# Patient Record
Sex: Male | Born: 1980 | Race: Black or African American | Hispanic: No | Smoking: Former smoker
Health system: Southern US, Community
[De-identification: ages and names within clinical notes are randomized; demographics above are authoritative.]

## PROBLEM LIST (undated history)

## (undated) DIAGNOSIS — Z789 Other specified health status: Secondary | ICD-10-CM

## (undated) HISTORY — PX: TOOTH EXTRACTION: SUR596

## (undated) HISTORY — PX: APPENDECTOMY: SHX54

---

## 2006-01-22 ENCOUNTER — Emergency Department: Payer: Self-pay | Admitting: Emergency Medicine

## 2007-07-17 ENCOUNTER — Emergency Department: Payer: Self-pay | Admitting: Emergency Medicine

## 2007-09-24 IMAGING — CR DG SHOULDER 3+V*L*
1 series · 3 of 3 positions shown · non-contrast
Comparison: none

REASON FOR EXAM: Trauma
COMMENTS:

PROCEDURE:     DXR - DXR SHOULDER LEFT COMPLETE  - January 22, 2006  [DATE]
RESULT:     Views of the LEFT shoulder reveal findings consistent with an AC
joint separation.  The glenohumeral joint is normal in appearance and the
subacromial/subdeltoid spaces appear normal.  The scapula is intact.

[Series 1: view not recorded · 0.17mm/px · 3 of 3 slices shown]
[im 1/3]
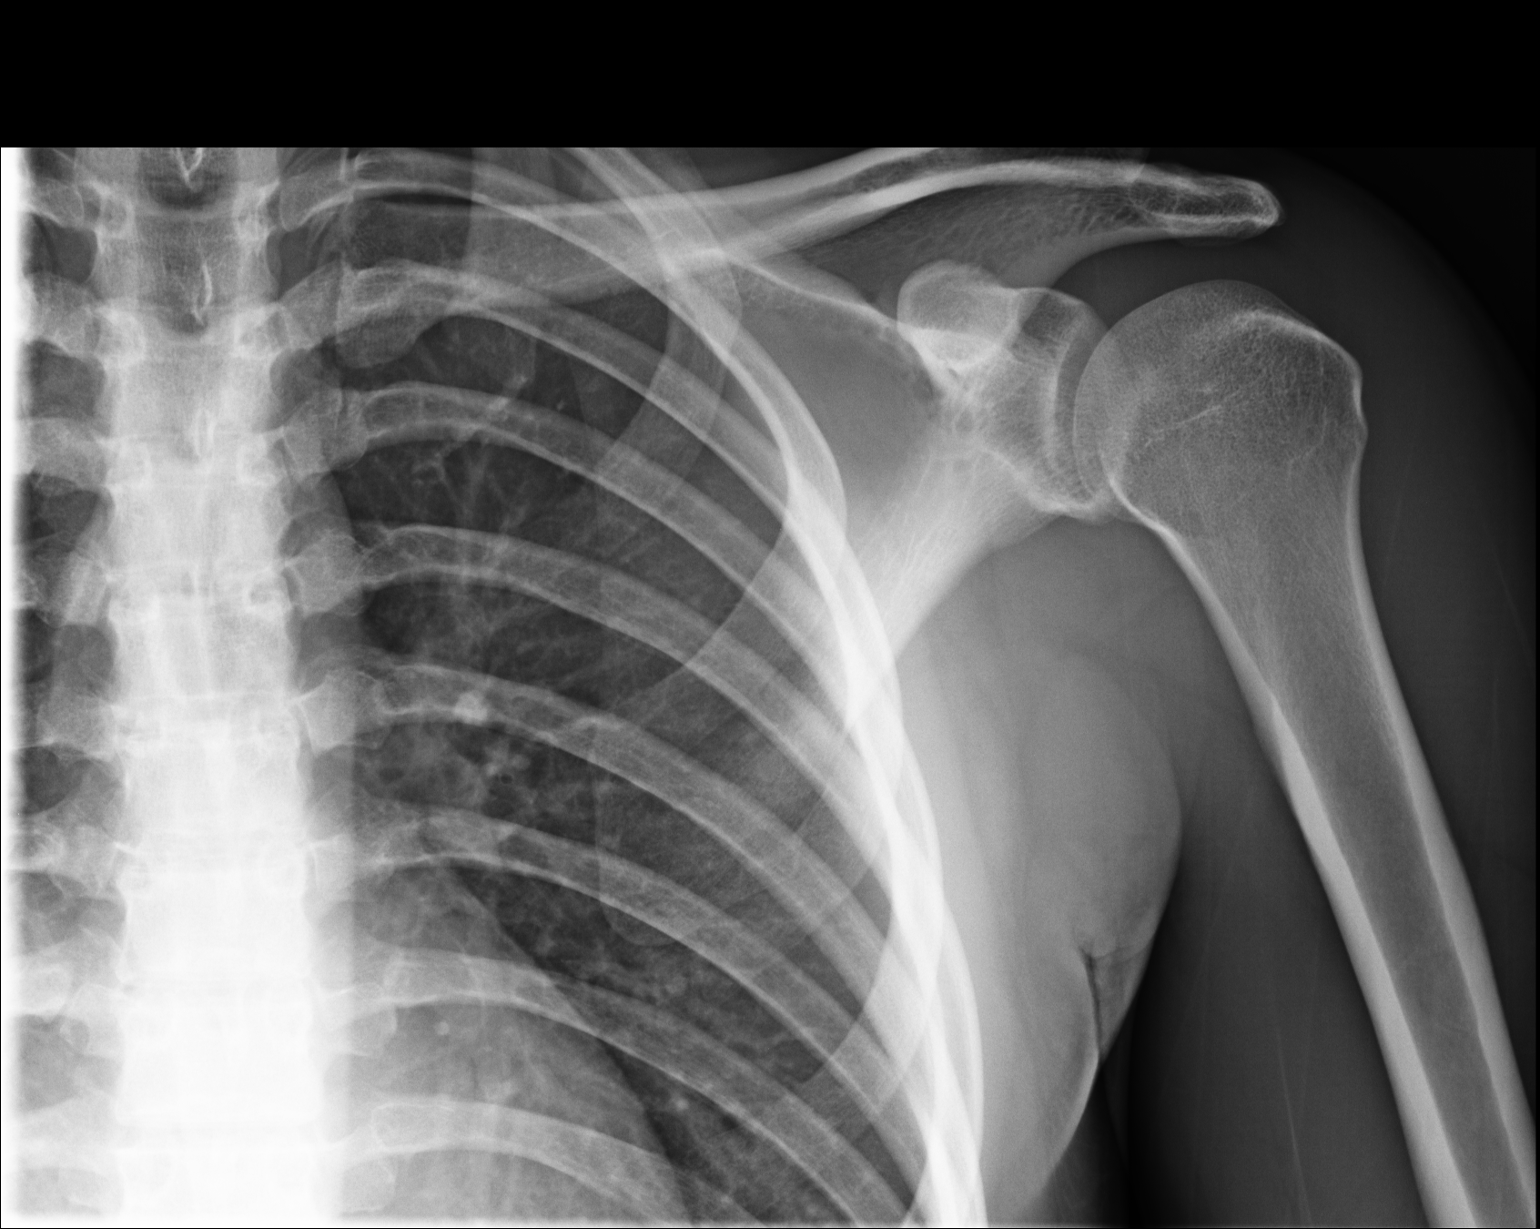
[im 2/3]
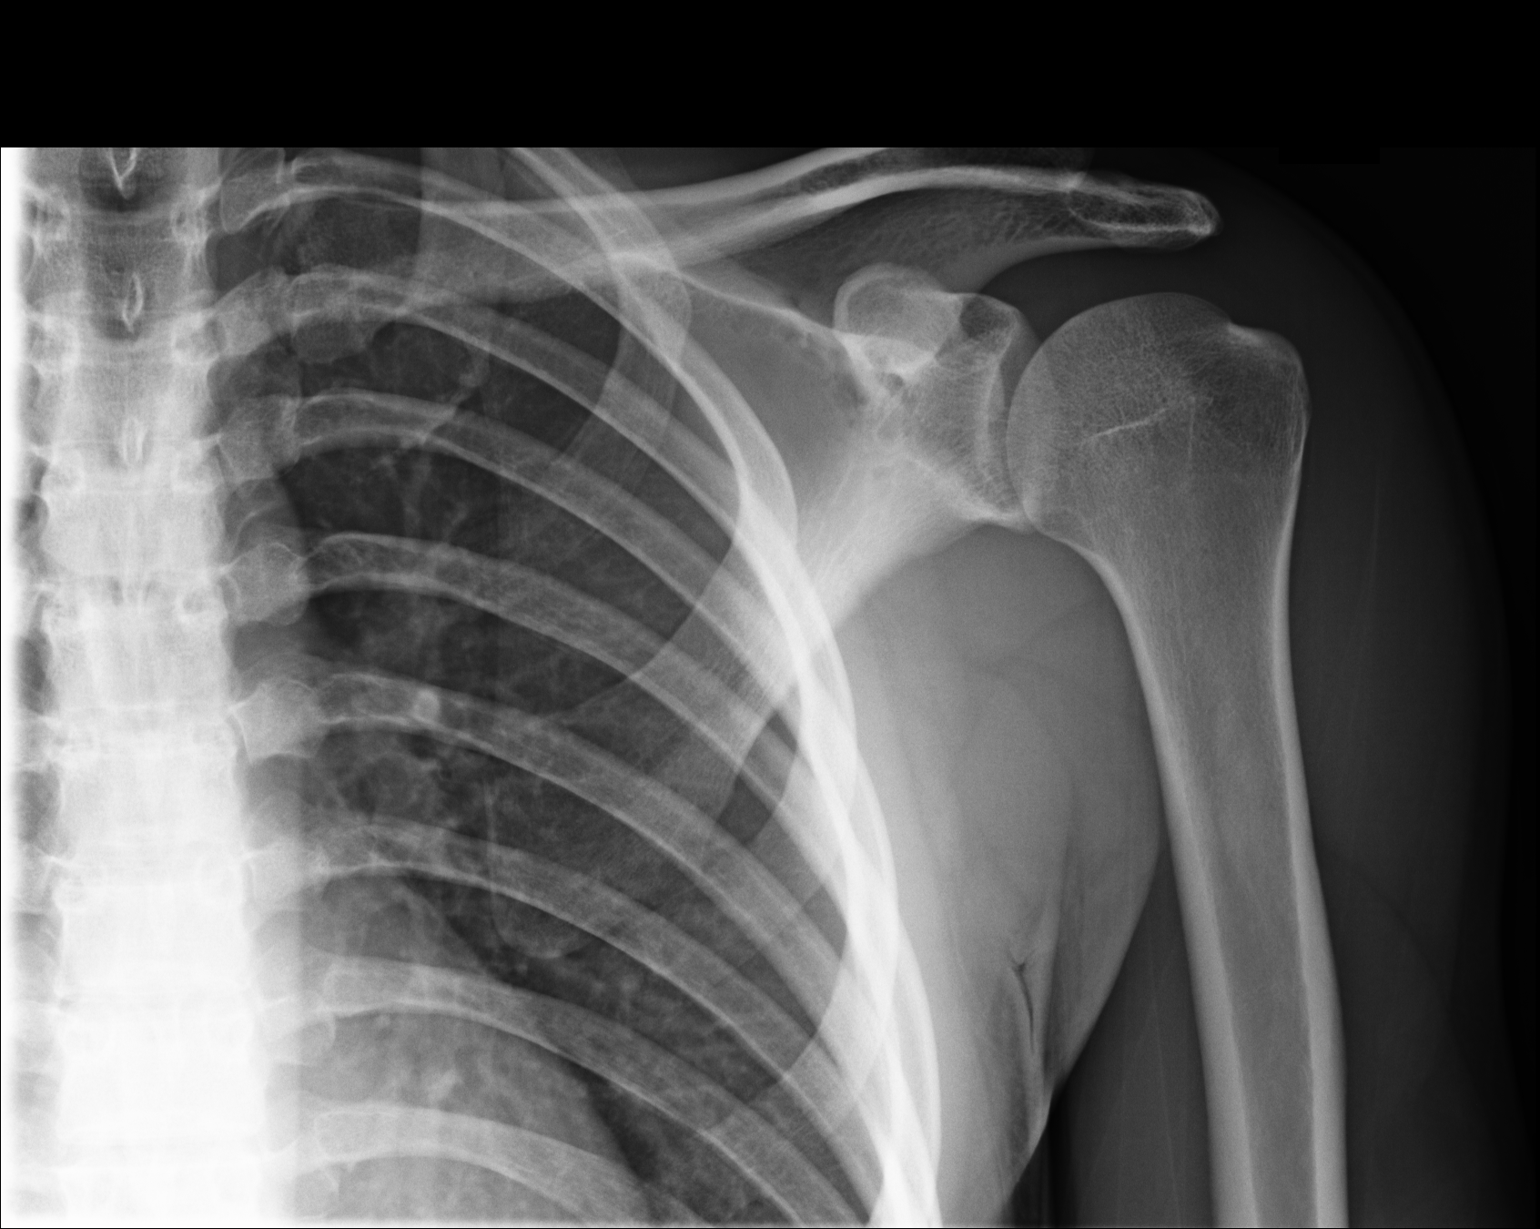
[im 3/3]
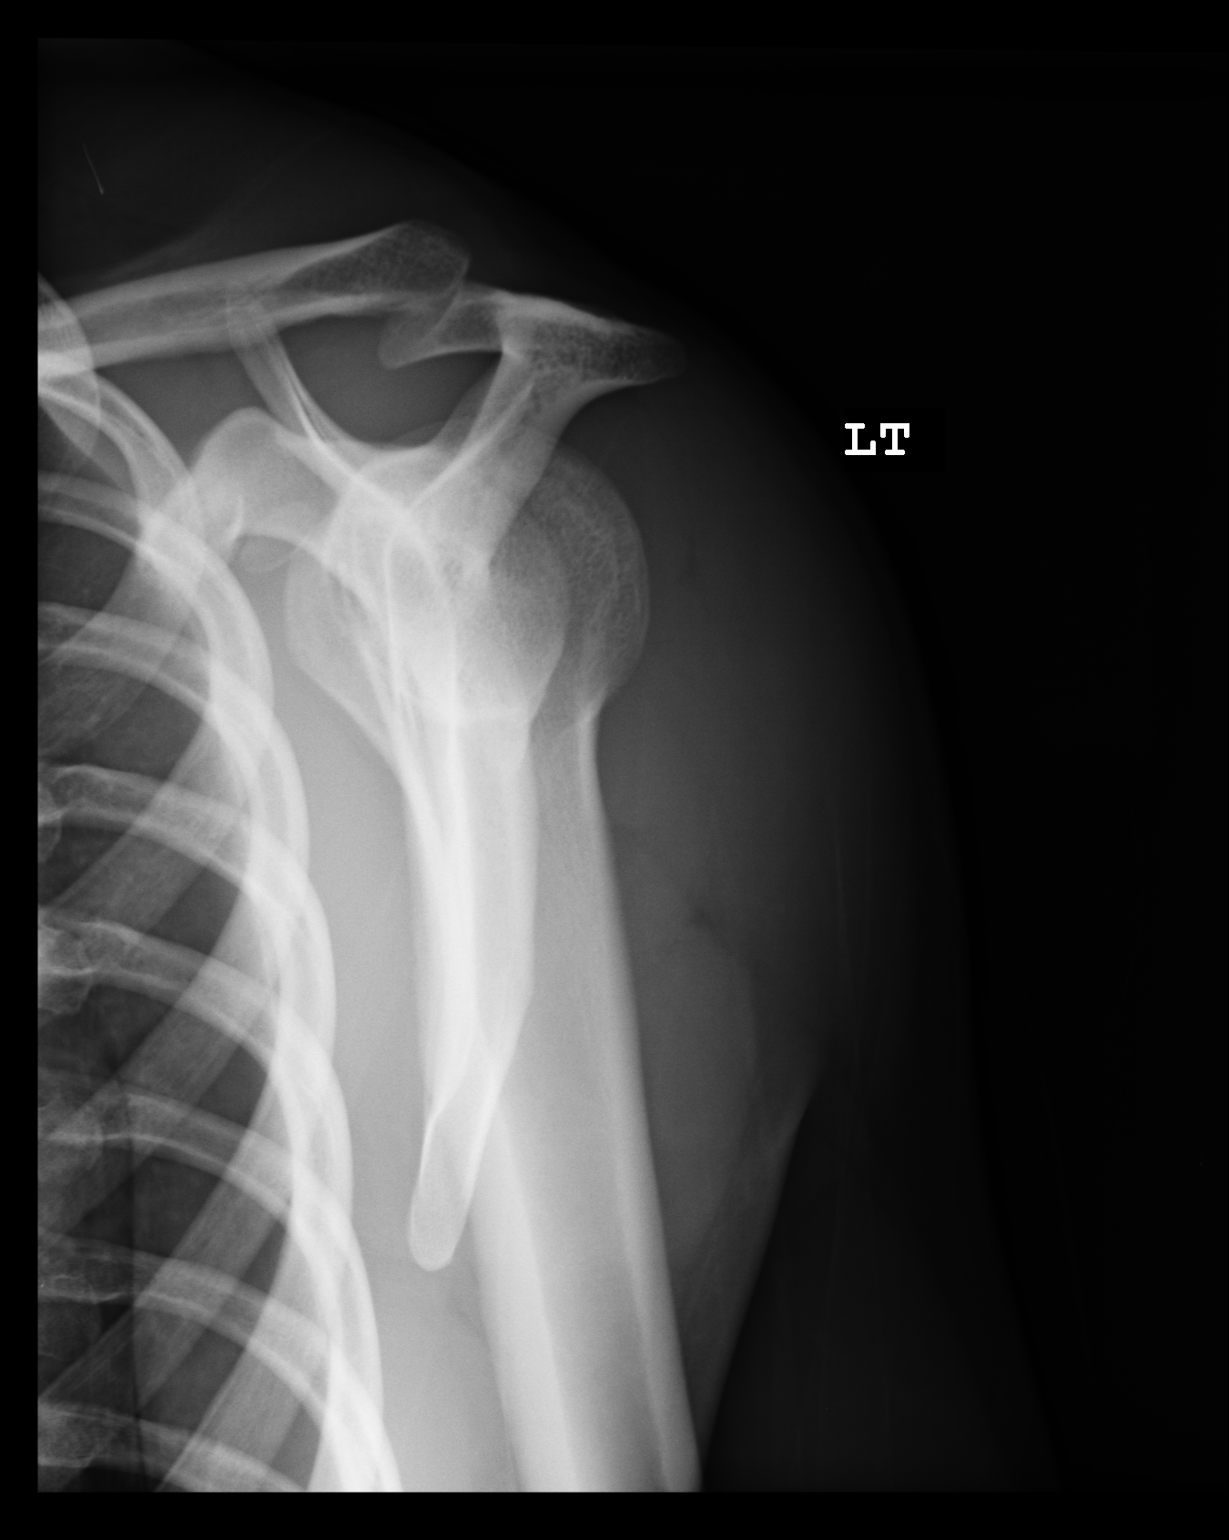

[3 of 3 positions shown; findings below may reference images not displayed]

IMPRESSION: The patient has sustained AC joint separation.

## 2008-09-28 ENCOUNTER — Emergency Department: Payer: Self-pay | Admitting: Emergency Medicine

## 2009-03-18 IMAGING — CR RIGHT HAND - COMPLETE 3+ VIEW
1 series · 3 of 3 positions shown · non-contrast
Comparison: none

REASON FOR EXAM: injury, pain, swelling
COMMENTS:   LMP: (Male)

PROCEDURE:     DXR - DXR HAND RT COMPLETE W/OBLIQUES  - July 17, 2007  [DATE]
RESULT:     No fracture, dislocation or other acute bony abnormality is
identified.

[Series 1: view not recorded · 0.17mm/px · 3 of 3 slices shown]
[im 1/3]
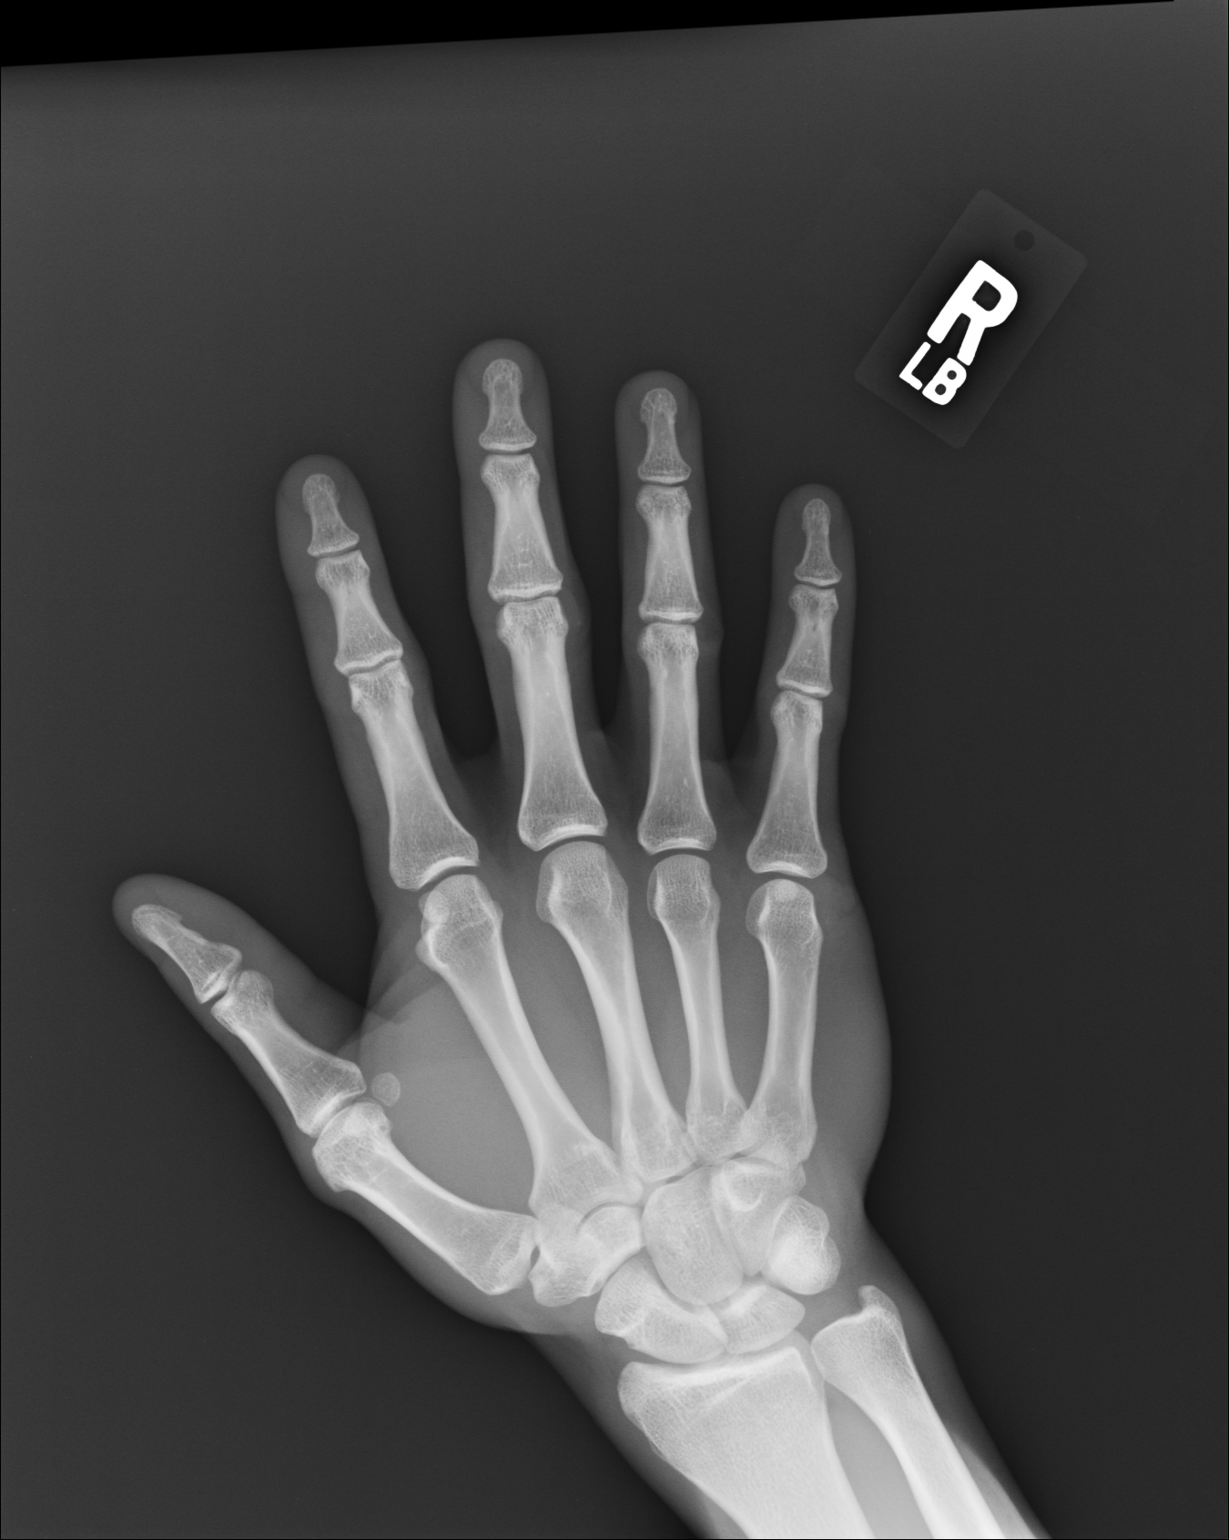
[im 2/3]
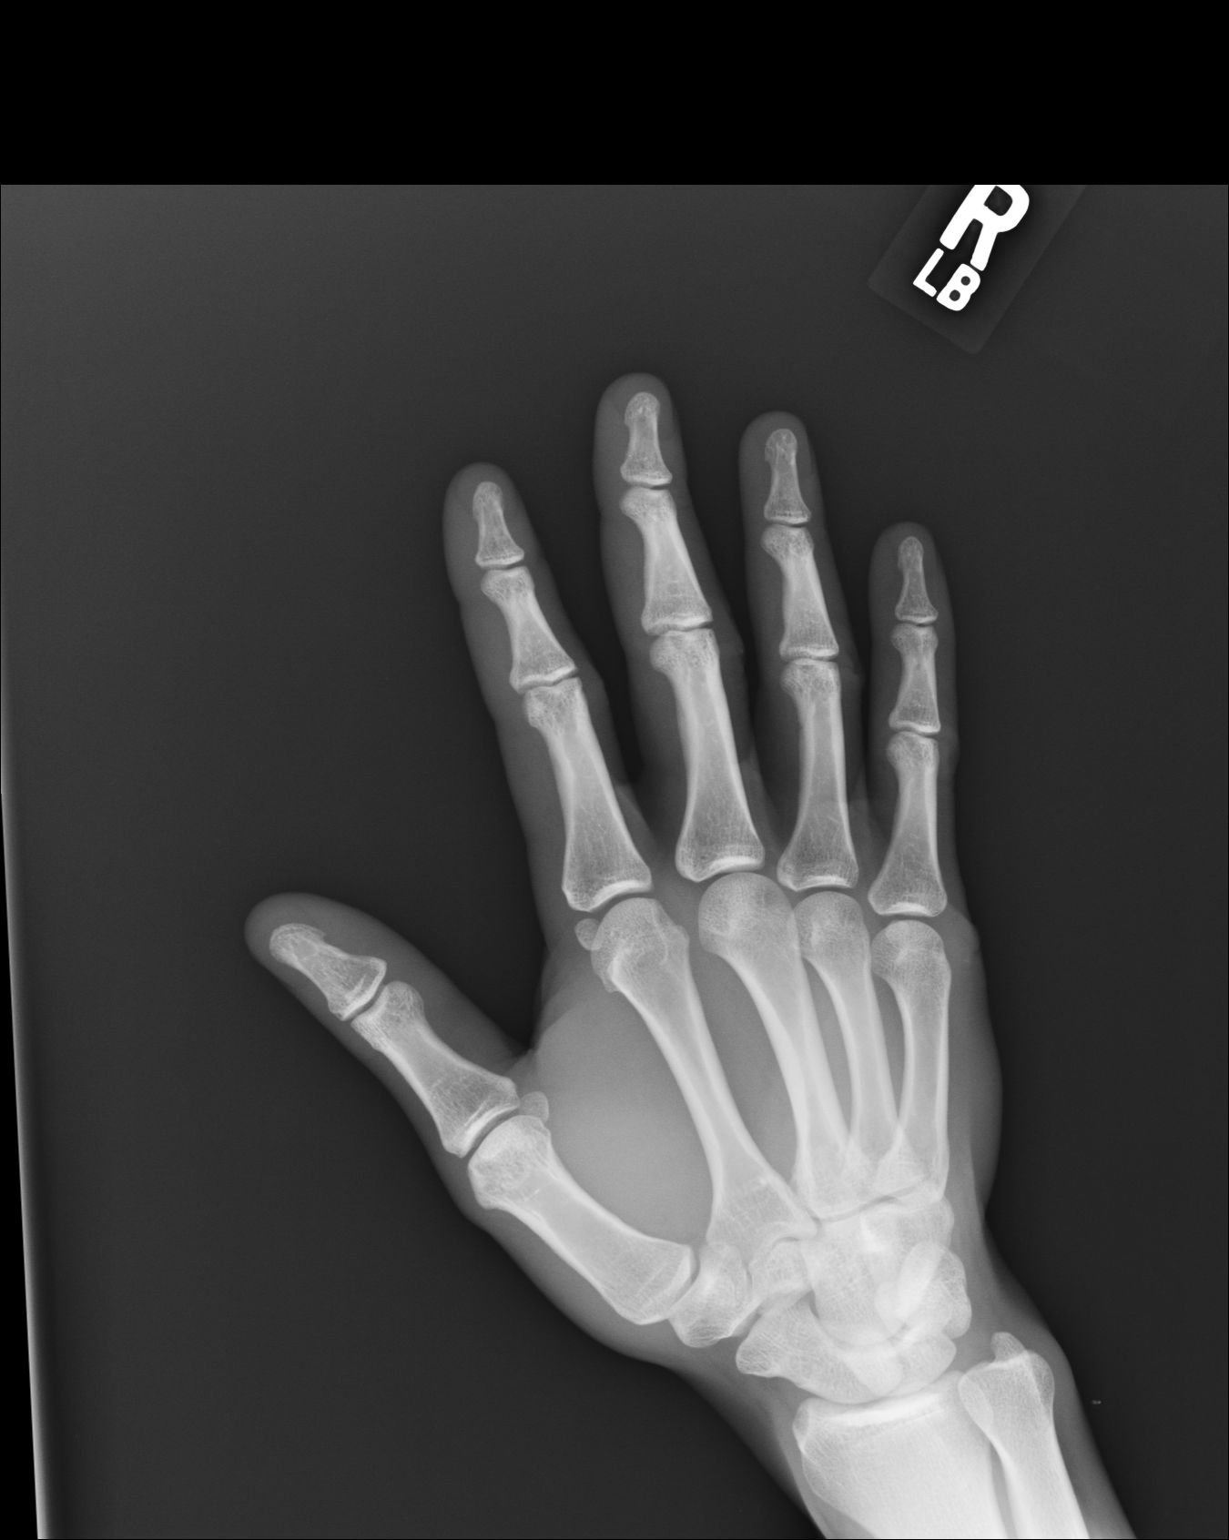
[im 3/3]
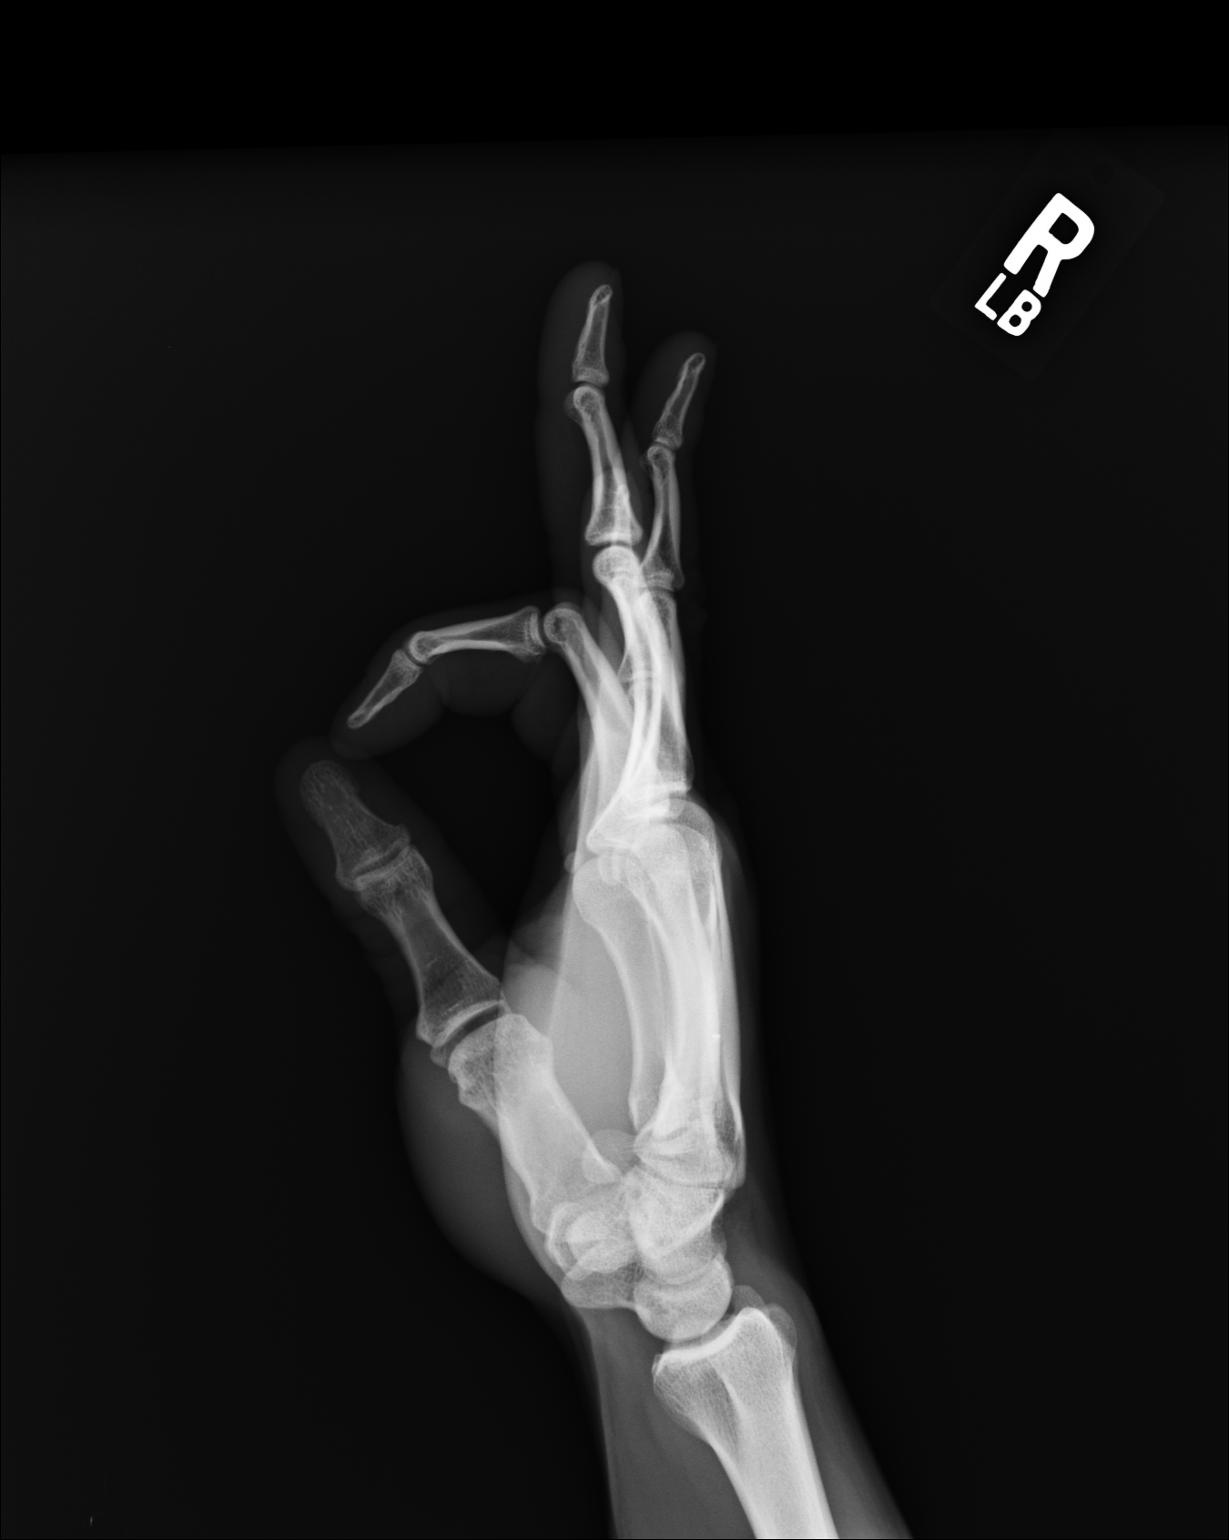

[3 of 3 positions shown; findings below may reference images not displayed]

IMPRESSION: 1.     No significant osseous abnormalities are noted.

## 2009-06-14 ENCOUNTER — Emergency Department: Payer: Self-pay | Admitting: Emergency Medicine

## 2019-07-19 ENCOUNTER — Ambulatory Visit: Payer: Self-pay | Attending: Internal Medicine

## 2021-11-09 ENCOUNTER — Encounter: Payer: Self-pay | Admitting: Ophthalmology

## 2021-11-10 ENCOUNTER — Ambulatory Visit: Payer: Commercial Managed Care - PPO | Admitting: Anesthesiology

## 2021-11-10 ENCOUNTER — Encounter: Payer: Self-pay | Admitting: Ophthalmology

## 2021-11-10 ENCOUNTER — Ambulatory Visit
Admission: RE | Admit: 2021-11-10 | Discharge: 2021-11-10 | Disposition: A | Discharge status: Home / Self Care | Payer: Commercial Managed Care - PPO | Source: Ambulatory Visit | Attending: Ophthalmology | Admitting: Ophthalmology

## 2021-11-10 ENCOUNTER — Encounter
Admission: RE | Disposition: A | Discharge status: Home / Self Care | Payer: Self-pay | Source: Ambulatory Visit | Attending: Ophthalmology

## 2021-11-10 ENCOUNTER — Other Ambulatory Visit: Payer: Self-pay

## 2021-11-10 DIAGNOSIS — Z87891 Personal history of nicotine dependence: Secondary | ICD-10-CM | POA: Diagnosis not present

## 2021-11-10 DIAGNOSIS — X58XXXA Exposure to other specified factors, initial encounter: Secondary | ICD-10-CM | POA: Diagnosis not present

## 2021-11-10 DIAGNOSIS — S0552XA Penetrating wound with foreign body of left eyeball, initial encounter: Secondary | ICD-10-CM | POA: Diagnosis present

## 2021-11-10 HISTORY — DX: Other specified health status: Z78.9

## 2021-11-10 SURGERY — CONJUNCTIVOPLASTY
Anesthesia: Monitor Anesthesia Care | Site: Eye | Laterality: Left

## 2021-11-10 MED ORDER — LACTATED RINGERS IV SOLN: INTRAVENOUS | Status: DC | PRN

## 2021-11-10 MED ORDER — MIDAZOLAM HCL 2 MG/2ML IJ SOLN
INTRAMUSCULAR | Status: DC | PRN
  Administered 2021-11-10: 2 mg via INTRAVENOUS

## 2021-11-10 MED ORDER — TETRACAINE HCL 0.5 % OP SOLN
OPHTHALMIC | Status: DC | PRN
  Administered 2021-11-10: 1 [drp] via OPHTHALMIC

## 2021-11-10 MED ORDER — LIDOCAINE-EPINEPHRINE 2 %-1:100000 IJ SOLN
INTRAMUSCULAR | Status: DC | PRN
  Administered 2021-11-10: .5 mL via INTRADERMAL

## 2021-11-10 MED ORDER — ONDANSETRON HCL 4 MG/2ML IJ SOLN
INTRAMUSCULAR | Status: DC | PRN
  Administered 2021-11-10: 4 mg via INTRAVENOUS

## 2021-11-10 MED ORDER — GLYCOPYRROLATE 0.2 MG/ML IJ SOLN
INTRAMUSCULAR | Status: DC | PRN
  Administered 2021-11-10: .1 mg via INTRAVENOUS

## 2021-11-10 MED ORDER — FENTANYL CITRATE (PF) 100 MCG/2ML IJ SOLN
INTRAMUSCULAR | Status: DC | PRN
  Administered 2021-11-10: 50 ug via INTRAVENOUS
  Administered 2021-11-10 (×2): 25 ug via INTRAVENOUS

## 2021-11-10 MED ORDER — NEOMYCIN-POLYMYXIN-DEXAMETH 3.5-10000-0.1 OP OINT
TOPICAL_OINTMENT | OPHTHALMIC | Status: DC | PRN
  Administered 2021-11-10: 1 via OPHTHALMIC

## 2021-11-10 SURGICAL SUPPLY — 18 items
APPLICATOR COTTON TIP WD 3 STR (MISCELLANEOUS) ×1 IMPLANT
BLADE SURG MINI STRL (BLADE) ×2 IMPLANT
BNDG EYE OVAL (GAUZE/BANDAGES/DRESSINGS) ×4 IMPLANT
CORD BIP STRL DISP 12FT (MISCELLANEOUS) ×1 IMPLANT
ERASER HMR WETFIELD 18G (MISCELLANEOUS) ×2 IMPLANT
GLOVE BIO SURGEON STRL SZ8 (GLOVE) ×2 IMPLANT
GLOVE SURG LX 8.0 MICRO (GLOVE) ×1
GLOVE SURG LX STRL 8.0 MICRO (GLOVE) ×1 IMPLANT
MARKER SKIN DUAL TIP RULER LAB (MISCELLANEOUS) ×2 IMPLANT
NDL HYPO 30GX1 BEV (NEEDLE) IMPLANT
NEEDLE HYPO 30GX1 BEV (NEEDLE) ×2 IMPLANT
PACK CATARACT (MISCELLANEOUS) ×2 IMPLANT
PROTECTOR LASIK FLAP (MISCELLANEOUS) ×1 IMPLANT
SUT VICRYL  9 0 (SUTURE) ×1
SUT VICRYL 9 0 (SUTURE) IMPLANT
SYR 10ML LL (SYRINGE) ×2 IMPLANT
WATER STERILE IRR 1000ML POUR (IV SOLUTION) ×2 IMPLANT
WIPE NON LINTING 3.25X3.25 (MISCELLANEOUS) ×3 IMPLANT

## 2021-11-10 NOTE — Transfer of Care (Signed)
Immediate Anesthesia Transfer of Care Note  Patient: Randall Nelson  Procedure(s) Performed: CONJUNCTIVAL EMBEDDED INCLUDES CONCRETIONS SUBCONJUNCTIVAL OR SCLERAL NONPERFORATING (Left: Eye)  Patient Location: PACU  Anesthesia Type: MAC  Level of Consciousness: awake, alert  and patient cooperative  Airway and Oxygen Therapy: Patient Spontanous Breathing and Patient connected to supplemental oxygen  Post-op Assessment: Post-op Vital signs reviewed, Patient's Cardiovascular Status Stable, Respiratory Function Stable, Patent Airway and No signs of Nausea or vomiting  Post-op Vital Signs: Reviewed and stable  Complications: No notable events documented.

## 2021-11-10 NOTE — Anesthesia Postprocedure Evaluation (Signed)
Anesthesia Post Note  Patient: Randall Nelson  Procedure(s) Performed: CONJUNCTIVAL EMBEDDED INCLUDES CONCRETIONS SUBCONJUNCTIVAL OR SCLERAL NONPERFORATING (Left: Eye)     Patient location during evaluation: PACU Anesthesia Type: MAC Level of consciousness: awake Pain management: pain level controlled Vital Signs Assessment: post-procedure vital signs reviewed and stable Respiratory status: respiratory function stable Cardiovascular status: stable Postop Assessment: no apparent nausea or vomiting Anesthetic complications: no   No notable events documented.  Veda Canning

## 2021-11-10 NOTE — H&P (Signed)
  [facility]   Primary Care Physician:  Patient, No Pcp Per Ophthalmologist: Dr.Thelda Gagan  Pre-Procedure History & Physical: HPI:  Aleksi Brummet is a 41 y.o. male here for cataract surgery.   Past Medical History:  Diagnosis Date   Medical history non-contributory     Past Surgical History:  Procedure Laterality Date   APPENDECTOMY     age 55   TOOTH EXTRACTION      Prior to Admission medications   Medication Sig Start Date End Date Taking? Authorizing Provider  polymixin-bacitracin (POLYSPORIN) 500-10000 UNIT/GM OINT ointment Apply 1 Application topically 2 (two) times daily.   Yes [provider]    Allergies as of 11/09/2021   (No Known Allergies)    History reviewed. No pertinent family history.  Social History   Socioeconomic History   Marital status: Single    Spouse name: Not on file   Number of children: Not on file   Years of education: Not on file   Highest education level: Not on file  Occupational History   Not on file  Tobacco Use   Smoking status: Former    Types: Cigarettes   Smokeless tobacco: Never   Tobacco comments:    Used to smoke occasional cigar  Vaping Use   Vaping Use: Never used  Substance and Sexual Activity   Alcohol use: Yes    Comment: Occasional   Drug use: Not on file   Sexual activity: Not on file  Other Topics Concern   Not on file  Social History Narrative   Not on file   Social Determinants of Health   Financial Resource Strain: Not on file  Food Insecurity: Not on file  Transportation Needs: Not on file  Physical Activity: Not on file  Stress: Not on file  Social Connections: Not on file  Intimate Partner Violence: Not on file    Review of Systems: See HPI, otherwise negative ROS  Physical Exam: BP 111/82   Pulse 79   Temp 97.9 F (36.6 C) (Temporal)   Resp 20   Ht 5\' 5"  (1.651 m)   Wt 76.7 kg   SpO2 97%   BMI 28.12 kg/m  General:   Alert, cooperative in NAD Head:  Normocephalic  and atraumatic. Respiratory:  Normal work of breathing. Cardiovascular:  RRR  Impression/Plan: Randall Nelson is here for cataract surgery.  Risks, benefits, limitations, and alternatives regarding cataract surgery have been reviewed with the patient.  Questions have been answered.  All parties agreeable.   Brent General, MD  11/10/2021, 10:30 AM

## 2021-11-10 NOTE — Anesthesia Procedure Notes (Signed)
Procedure Name: MAC Date/Time: 11/10/2021 10:45 AM  Performed by: Dionne Bucy, CRNAPre-anesthesia Checklist: Patient identified, Emergency Drugs available, Suction available, Patient being monitored and Timeout performed Patient Re-evaluated:Patient Re-evaluated prior to induction Oxygen Delivery Method: Nasal cannula Placement Confirmation: positive ETCO2

## 2021-11-10 NOTE — Op Note (Signed)
PREOPERATIVE DIAGNOSIS:  multiple metallic FB beneath the conj of the left eye.   POSTOPERATIVE DIAGNOSIS: multiple metallic FB beneath the conj of the left eye.   OPERATIVE PROCEDURE:ORPROCALL@   SURGEON:  Galen Manila, MD.   ANESTHESIA:  Anesthesiologist: Jola Babinski, MD CRNA: Lily Kocher, CRNA  1.      Managed anesthesia care. 2.     2 % lidocaine with epi placed subconj OS   COMPLICATIONS:  None.   TECHNIQUE:   peritomy and globe exploration with FB removal   DESCRIPTION OF PROCEDURE:  The patient was examined and consented in the preoperative holding area where the aforementioned topical anesthesia was applied to the left eye and then brought back to the Operating Room where the left eye was prepped and draped in the usual sterile ophthalmic fashion and a lid speculum was placed.  Lidocaine was infiltrated sub conj.  A limited peritomy was created both nasally and laterally. Multiple FB were removed  The sclera was found to be intact. The wounds were flushed and light wet field cautery ws appied where needed. A total of 6 interrupted 9-0 Vicryl sutures were placed to close the peritomies. The eye was dressed with Maxitrol ung.  Pt will place ung TID and see me in one day at [facility]* No implants in log *  Procedure(s): CONJUNCTIVAL EMBEDDED INCLUDES CONCRETIONS SUBCONJUNCTIVAL OR SCLERAL NONPERFORATING (Left)  Electronically signed: Galen Manila 11/10/2021 11:26 AM

## 2021-11-10 NOTE — Anesthesia Preprocedure Evaluation (Signed)
Anesthesia Evaluation  Patient identified by MRN, date of birth, ID band Patient awake    Reviewed: Allergy & Precautions, NPO status   Airway Mallampati: II  TM Distance: >3 FB     Dental   Pulmonary neg pulmonary ROS, former smoker,    Pulmonary exam normal        Cardiovascular negative cardio ROS   Rhythm:Regular Rate:Normal     Neuro/Psych    GI/Hepatic negative GI ROS,   Endo/Other    Renal/GU      Musculoskeletal   Abdominal   Peds  Hematology   Anesthesia Other Findings   Reproductive/Obstetrics                             Anesthesia Physical Anesthesia Plan  ASA: 1  Anesthesia Plan: MAC   Post-op Pain Management: Minimal or no pain anticipated   Induction: Intravenous  PONV Risk Score and Plan: 1 and TIVA, Midazolam and Treatment may vary due to age or medical condition  Airway Management Planned: Natural Airway and Nasal Cannula  Additional Equipment:   Intra-op Plan:   Post-operative Plan:   Informed Consent: I have reviewed the patients History and Physical, chart, labs and discussed the procedure including the risks, benefits and alternatives for the proposed anesthesia with the patient or authorized representative who has indicated his/her understanding and acceptance.       Plan Discussed with: CRNA  Anesthesia Plan Comments:         Anesthesia Quick Evaluation
